# Patient Record
Sex: Female | Born: 2005 | Race: Black or African American | Hispanic: No | Marital: Single | Smoking: Never smoker
Health system: Southern US, Community
[De-identification: ages and names within clinical notes are randomized; demographics above are authoritative.]

## PROBLEM LIST (undated history)

## (undated) DIAGNOSIS — T783XXA Angioneurotic edema, initial encounter: Secondary | ICD-10-CM

## (undated) DIAGNOSIS — J302 Other seasonal allergic rhinitis: Secondary | ICD-10-CM

## (undated) DIAGNOSIS — L509 Urticaria, unspecified: Secondary | ICD-10-CM

## (undated) DIAGNOSIS — L309 Dermatitis, unspecified: Secondary | ICD-10-CM

## (undated) HISTORY — DX: Urticaria, unspecified: L50.9

## (undated) HISTORY — DX: Other seasonal allergic rhinitis: J30.2

## (undated) HISTORY — DX: Angioneurotic edema, initial encounter: T78.3XXA

## (undated) HISTORY — DX: Dermatitis, unspecified: L30.9

## (undated) HISTORY — PX: UMBILICAL HERNIA REPAIR: SHX2598

---
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
## 2023-01-01 ENCOUNTER — Encounter: Payer: Self-pay | Admitting: Family Medicine

## 2023-01-01 ENCOUNTER — Telehealth: Payer: Medicaid Other | Admitting: Family Medicine

## 2023-01-01 DIAGNOSIS — L2089 Other atopic dermatitis: Secondary | ICD-10-CM | POA: Diagnosis not present

## 2023-01-01 MED ORDER — PREDNISONE 10 MG (21) PO TBPK: ORAL_TABLET | ORAL | 0 refills | Status: AC

## 2023-01-01 NOTE — Progress Notes (Signed)
Virtual Visit Consent   Julie Wood, you are scheduled for a virtual visit with a [facility] provider today. Just as with appointments in the office, your consent must be obtained to participate. Your consent will be active for this visit and any virtual visit you may have with one of our providers in the next 365 days. If you have a MyChart account, a copy of this consent can be sent to you electronically.  As this is a virtual visit, video technology does not allow for your provider to perform a traditional examination. This may limit your provider's ability to fully assess your condition. If your provider identifies any concerns that need to be evaluated in person or the need to arrange testing (such as labs, EKG, etc.), we will make arrangements to do so. Although advances in technology are sophisticated, we cannot ensure that it will always work on either your end or our end. If the connection with a video visit is poor, the visit may have to be switched to a telephone visit. With either a video or telephone visit, we are not always able to ensure that we have a secure connection.  By engaging in this virtual visit, you consent to the provision of healthcare and authorize for your insurance to be billed (if applicable) for the services provided during this visit. Depending on your insurance coverage, you may receive a charge related to this service.  I need to obtain your verbal consent now. Are you willing to proceed with your visit today? Julie Wood has provided verbal consent on 01/01/2023 for a virtual visit (video or telephone). Freddy Finner, NP  Date: 01/01/2023 9:05 AM  Virtual Visit via Video Note   I, Freddy Finner, connected with  Julie Wood  ([id number], 05/08/2005) on 01/01/23 at  9:00 AM EST by a video-enabled telemedicine application and verified that I am speaking with the correct person using two identifiers.  Location: Patient: Virtual Visit Location Patient:  Home Provider: Virtual Visit Location Provider: Home Office   I discussed the limitations of evaluation and management by telemedicine and the availability of in person appointments. The patient expressed understanding and agreed to proceed.    History of Present Illness: Julie Wood is a 17 y.o. 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   Oral allergy syndrome, subsequent encounter 03/25/2018   Seasonal and perennial allergic rhinoconjunctivitis 03/25/2018   Allergic rhinitis 05/15/2013   Other atopic dermatitis 05/15/2013    Allergies:  Allergies  Allergen Reactions   Peanut-Containing Drug Products    Citrus    Other     Tree nuts Raw potato -hives with contact exposure Melons   Shellfish Allergy    Medications:  Current Outpatient Medications:    busPIRone (BUSPAR) 7.5 MG tablet, Take 7.5 mg by mouth 2 (two) times daily., Disp: , Rfl:    cetirizine (ZYRTEC) 10 MG tablet, Take 1 tablet daily as needed for runny nose or itching, Disp: 30 tablet, Rfl: 5   CONCERTA 27 MG  CR tablet, , Disp: , Rfl:    Crisaborole (EUCRISA) 2 % OINT, Apply 1 application  topically 2 (two) times daily as needed (eczema flare)., Disp: 60 g, Rfl: 5   cromolyn (OPTICROM) 4 % ophthalmic solution, Place 1 drop into both eyes 4 (four) times daily as needed (watery itchy eyes)., Disp: 10 mL, Rfl: 5   desonide (DESOWEN) 0.05 % cream, Use 1 application sparingly twice a day as needed to red itchy areas.  This is safe to use on face and neck, Disp:  30 g, Rfl: 5   DUPIXENT 300 MG/2ML prefilled syringe, INJECT 300MG  SUBCUTANEOUSLY  EVERY OTHER WEEK, Disp: 4 mL, Rfl: 11   EPINEPHrine (AUVI-Q) 0.3 mg/0.3 mL IJ SOAJ injection, Inject 0.3 mg into the muscle once as needed for up to 1 dose. (Patient not taking: Reported on 02/20/2022), Disp: 2 each, Rfl: 1   EPINEPHrine 0.3 mg/0.3 mL IJ SOAJ injection, Inject 0.3 mg into the muscle as needed for anaphylaxis., Disp: 2 each, Rfl: 1   fluticasone (FLONASE) 50 MCG/ACT nasal spray, Place 1 spray into both nostrils 2 (two) times daily., Disp: , Rfl:    hydrocortisone 2.5 % cream, Apply topically 2 (two) times daily as needed (eczema). Okay to use on the face sparingly - 1 week at a time max., Disp: 30 g, Rfl: 2   montelukast (SINGULAIR) 10 MG tablet, Take 1 tablet (10 mg total) by mouth at bedtime., Disp: 30 tablet, Rfl: 5   Olopatadine HCl (PATADAY) 0.2 % SOLN, Place 1 drop into both eyes daily. Use as needed for itchy water eyes, Disp: 2.5 mL, Rfl: 5   triamcinolone (NASACORT) 55 MCG/ACT AERO nasal inhaler, Place 2 sprays into the nose daily as needed (nasal congestion)., Disp: 1 each, Rfl: 5   triamcinolone cream (KENALOG) 0.1 %, Apply 1 Application topically 2 (two) times daily as needed (eczema flare). Do not use on the face, neck, armpits or groin area. Do not use more than 3 weeks in a row., Disp: 80 g, Rfl: 3  Observations/Objective: Patient is well-developed, well-nourished in no acute distress.  Resting comfortably  at home.  Head is normocephalic, atraumatic.  No labored breathing.  Speech is clear and coherent with logical content.  Patient is alert and oriented at baseline.    Assessment and Plan:  1. Other atopic dermatitis  - predniSONE (STERAPRED UNI-PAK 21 TAB) 10 MG (21) TBPK tablet; Take as directed  Dispense: 21 tablet; Refill: 0  -follow up with allergist  -continue creams and as ordered ointments -take pred and if not better be seen in person -avoid shellfish    Reviewed  side effects, risks and benefits of medication.    Patient acknowledged agreement and understanding of the plan.   Past Medical, Surgical, Social History, Allergies, and Medications have been Reviewed.   Follow Up Instructions: I discussed the assessment and treatment plan with the patient. The patient was provided an opportunity to ask questions and all were answered. The patient agreed with the plan and demonstrated an understanding of the instructions.  A copy of instructions were sent to the patient via MyChart unless otherwise noted below.    The patient was advised to call back or seek an in-person evaluation if the symptoms worsen or if the condition fails to improve as anticipated.    Freddy Finner, NP

## 2023-01-01 NOTE — Patient Instructions (Addendum)
Julie Wood, thank you for joining Julie Finner, NP for today's virtual visit.  While this provider is not your primary care provider (PCP), if your PCP is located in our provider database this encounter information will be shared with them immediately following your visit.   A [facility] MyChart account gives you access to today's visit and all your visits, tests, and labs performed at [facility] " click here if you don't have a [facility] MyChart account or go to [email or web address]  Consent: (Patient) Julie Wood provided verbal consent for this virtual visit at the beginning of the encounter.  Current Medications:  Current Outpatient Medications:    predniSONE (STERAPRED UNI-PAK 21 TAB) 10 MG (21) TBPK tablet, Take as directed, Disp: 21 tablet, Rfl: 0   busPIRone (BUSPAR) 7.5 MG tablet, Take 7.5 mg by mouth 2 (two) times daily., Disp: , Rfl:    cetirizine (ZYRTEC) 10 MG tablet, Take 1 tablet daily as needed for runny nose or itching, Disp: 30 tablet, Rfl: 5   CONCERTA 27 MG CR tablet, , Disp: , Rfl:    Crisaborole (EUCRISA) 2 % OINT, Apply 1 application  topically 2 (two) times daily as needed (eczema flare)., Disp: 60 g, Rfl: 5   cromolyn (OPTICROM) 4 % ophthalmic solution, Place 1 drop into both eyes 4 (four) times daily as needed (watery itchy eyes)., Disp: 10 mL, Rfl: 5   desonide (DESOWEN) 0.05 % cream, Use 1 application sparingly twice a day as needed to red itchy areas.  This is safe to use on face and neck, Disp: 30 g, Rfl: 5   DUPIXENT 300 MG/2ML prefilled syringe, INJECT 300MG  SUBCUTANEOUSLY  EVERY OTHER WEEK, Disp: 4 mL, Rfl: 11   EPINEPHrine (AUVI-Q) 0.3 mg/0.3 mL IJ SOAJ injection, Inject 0.3 mg into the muscle once as needed for up to 1 dose. (Patient not taking: Reported on 02/20/2022), Disp: 2 each, Rfl: 1   EPINEPHrine 0.3 mg/0.3 mL IJ SOAJ injection, Inject 0.3 mg into the muscle as needed for anaphylaxis., Disp: 2 each, Rfl: 1   fluticasone  (FLONASE) 50 MCG/ACT nasal spray, Place 1 spray into both nostrils 2 (two) times daily., Disp: , Rfl:    hydrocortisone 2.5 % cream, Apply topically 2 (two) times daily as needed (eczema). Okay to use on the face sparingly - 1 week at a time max., Disp: 30 g, Rfl: 2   montelukast (SINGULAIR) 10 MG tablet, Take 1 tablet (10 mg total) by mouth at bedtime., Disp: 30 tablet, Rfl: 5   Olopatadine HCl (PATADAY) 0.2 % SOLN, Place 1 drop into both eyes daily. Use as needed for itchy water eyes, Disp: 2.5 mL, Rfl: 5   triamcinolone (NASACORT) 55 MCG/ACT AERO nasal inhaler, Place 2 sprays into the nose daily as needed (nasal congestion)., Disp: 1 each, Rfl: 5   triamcinolone cream (KENALOG) 0.1 %, Apply 1 Application topically 2 (two) times daily as needed (eczema flare). Do not use on the face, neck, armpits or groin area. Do not use more than 3 weeks in a row., Disp: 80 g, Rfl: 3   Medications ordered in this encounter:  Meds ordered this encounter  Medications   predniSONE (STERAPRED UNI-PAK 21 TAB) 10 MG (21) TBPK tablet    Sig: Take as directed    Dispense:  21 tablet    Refill:  0    Order Specific Question:   Supervising Provider    AnswerMerrilee Jansky [id number]     *If  you need refills on other medications prior to your next appointment, please contact your pharmacy*  Follow-Up: Call back or seek an in-person evaluation if the symptoms worsen or if the condition fails to improve as anticipated.  [facility] [phone]  Other Instructions  -follow up with allergist  -continue creams and as ordered ointments -take pred and if not better be seen in person -avoid shellfish   If you have been instructed to have an in-person evaluation today at a local Urgent Care facility, please use the link below. It will take you to a list of all of our available [facility], including address, phone number and hours of operation. Please do not delay care.  [facility]  If you or a family member do not have a primary care provider, use the link below to schedule a visit and establish care. When you choose a [facility] primary care physician or advanced practice provider, you gain a long-term partner in health. Find a Primary Care Provider  Learn more about [facility]'s in-office and virtual care options: [facility] - Get Care Now

## 2023-03-03 ENCOUNTER — Other Ambulatory Visit: Payer: Self-pay

## 2023-03-03 ENCOUNTER — Ambulatory Visit (INDEPENDENT_AMBULATORY_CARE_PROVIDER_SITE_OTHER): Payer: Medicaid Other | Admitting: Family Medicine

## 2023-03-03 ENCOUNTER — Encounter: Payer: Self-pay | Admitting: Family Medicine

## 2023-03-03 VITALS — BP 102/60 | HR 88 | Temp 98.0°F | Resp 16 | Ht 66.54 in | Wt 199.0 lb

## 2023-03-03 DIAGNOSIS — J302 Other seasonal allergic rhinitis: Secondary | ICD-10-CM

## 2023-03-03 DIAGNOSIS — H1013 Acute atopic conjunctivitis, bilateral: Secondary | ICD-10-CM | POA: Diagnosis not present

## 2023-03-03 DIAGNOSIS — J3089 Other allergic rhinitis: Secondary | ICD-10-CM | POA: Diagnosis not present

## 2023-03-03 DIAGNOSIS — L2089 Other atopic dermatitis: Secondary | ICD-10-CM

## 2023-03-03 DIAGNOSIS — T7800XD Anaphylactic reaction due to unspecified food, subsequent encounter: Secondary | ICD-10-CM

## 2023-03-03 DIAGNOSIS — H1010 Acute atopic conjunctivitis, unspecified eye: Secondary | ICD-10-CM

## 2023-03-03 MED ORDER — EPINEPHRINE 0.3 MG/0.3ML IJ SOAJ: 0.3000 mg | INTRAMUSCULAR | 1 refills | Status: AC | PRN

## 2023-03-03 MED ORDER — MONTELUKAST SODIUM 10 MG PO TABS: 10.0000 mg | ORAL_TABLET | Freq: Every day | ORAL | 5 refills | Status: DC

## 2023-03-03 MED ORDER — TRIAMCINOLONE ACETONIDE 0.1 % EX CREA: 1.0000 | TOPICAL_CREAM | Freq: Two times a day (BID) | CUTANEOUS | 5 refills | Status: DC | PRN

## 2023-03-03 MED ORDER — MOMETASONE FUROATE 0.1 % EX OINT: TOPICAL_OINTMENT | Freq: Every day | CUTANEOUS | 0 refills | Status: DC

## 2023-03-03 NOTE — Progress Notes (Addendum)
 [address] [place] [address] Dept: [phone]  FOLLOW UP NOTE  Patient ID: Julie Wood, female    DOB: [birth year]  Age: 18 y.o. MRN: [id number] Date of Office Visit: 03/03/2023  Assessment  Chief Complaint: Follow-up and Medication Refill  HPI Julie Wood is a 18 year old female who presents to the clinic for follow-up visit.  She was last seen in this clinic on 07/08/2022 by Wanda Craze, FNP, for evaluation of allergic rhinitis on allergen immunotherapy, allergic conjunctivitis, atopic dermatitis, and food allergy  to peanut, tree nut, citrus, and shellfish.   At today's visit, she reports her allergic rhinitis has been moderately well-controlled with occasional sneezing as the main symptom.  She does report some symptoms occurring mainly in the spring.  She continues montelukast  10 mg once a day, an antihistamine as needed, and Nasacort  as needed.  She originally began allergen immunotherapy on 07/29/2017, however, she has been very inconsistent with receiving allergen immunotherapy.  Chart review indicates allergen immunotherapy has been restarted 6 times.  We discussed updating her allergy  testing possibly initiating oral immunotherapy directed toward Timothy grass, short ragweed, or dust mite.  Allergic conjunctivitis is reported as moderately well-controlled with occasional red and itchy eyes for which he uses cromolyn  eyedrops with relief of symptoms.  Atopic dermatitis is reported as moderately well-controlled with occasional red and itchy areas occurring in a flare of remission pattern occurring mainly in the antecubital fossa, back of her neck, back of her ears, on the top of her eyes, and the sides of her nose.  She continues a twice a day moisturizing routine and has been using triamcinolone  on her face.  She continues Dupixent  injections 300 mg once every 14 days with no large or local reactions.  She reports a significant decrease in her symptoms of atopic  dermatitis while continuing on Dupixent  injections.  She reports that she tried her friend's medication with relief of symptoms.  She reported this medication was mometasone .  We discussed the use of triamcinolone  or mometasone  only below her face and discussed using a nonsteroid medication on red and itchy areas occurring on her face.  She continues to avoid peanuts, tree nuts, shellfish, and citrus fruits with no accidental ingestion or EpiPen  use since her last visit to this clinic.  Her last food allergy  skin testing was on 07/09/2022 and was negative to selected foods with adequate controls.  EpiPen 's at this time will be reordered at today's visit.  Her current medications are listed in the chart.  Drug Allergies:  Allergies  Allergen Reactions   Peanut-Containing Drug Products    Citrus    Other     Tree nuts Raw potato -hives with contact exposure Melons   Shellfish Allergy      Physical Exam: BP 102/60   Pulse 88   Temp 98 F (36.7 C) (Temporal)   Resp 16   Ht 5' 6.54 (1.69 m)   Wt 199 lb (90.3 kg)   SpO2 98%   BMI 31.60 kg/m    Physical Exam Vitals reviewed.  Constitutional:      Appearance: Normal appearance.  HENT:     Head: Normocephalic and atraumatic.     Right Ear: Tympanic membrane normal.     Left Ear: Tympanic membrane normal.     Nose:     Comments: Bilateral naris normal.  Pharynx normal.  Ears normal.  Eyes normal.    Mouth/Throat:     Pharynx: Oropharynx is clear.  Eyes:  Conjunctiva/sclera: Conjunctivae normal.  Cardiovascular:     Rate and Rhythm: Normal rate and regular rhythm.     Heart sounds: Normal heart sounds. No murmur heard. Pulmonary:     Effort: Pulmonary effort is normal.     Breath sounds: Normal breath sounds.     Comments: Lungs clear to auscultation Musculoskeletal:        General: Normal range of motion.     Cervical back: Normal range of motion and neck supple.  Skin:    General: Skin is warm and dry.     Comments:  Some dry and reddened areas noted on both sides of her nose.  No open areas or drainage noted.  Neurological:     Mental Status: She is alert and oriented to person, place, and time.  Psychiatric:        Mood and Affect: Mood normal.        Behavior: Behavior normal.        Thought Content: Thought content normal.        Judgment: Judgment normal.     Assessment and Plan: 1. Seasonal and perennial allergic rhinitis   2. Seasonal allergic conjunctivitis   3. Other atopic dermatitis   4. Anaphylactic reaction due to food, subsequent encounter     Meds ordered this encounter  Medications   EPINEPHrine  0.3 mg/0.3 mL IJ SOAJ injection    Sig: Inject 0.3 mg into the muscle as needed for anaphylaxis.    Dispense:  2 each    Refill:  1    May dispense generic/Mylan/Teva brand.   DISCONTD: triamcinolone  cream (KENALOG ) 0.1 %    Sig: Apply 1 Application topically 2 (two) times daily as needed (eczema flare). Do not use on the face, neck, armpits or groin area. Do not use more than 3 weeks in a row.    Dispense:  80 g    Refill:  5   montelukast  (SINGULAIR ) 10 MG tablet    Sig: Take 1 tablet (10 mg total) by mouth at bedtime.    Dispense:  30 tablet    Refill:  5   mometasone  (ELOCON ) 0.1 % ointment    Sig: Apply topically daily.    Dispense:  45 g    Refill:  0    Patient Instructions  Allergic rhinitis Continue allergen avoidance measures directed toward grass pollen, weed pollen, ragweed pollen, tree pollen, mold, dust mite, cat, and dog as listed below Continue an antihistamine once a day as needed for runny nose or itch. Remember to rotate to a different antihistamine about every 3 months. Some examples of over the counter antihistamines include Zyrtec  (cetirizine ), Xyzal  (levocetirizine), Allegra  (fexofenadine ), and Claritin (loratidine).  Continue montelukast  10 mg once a day for allergy  symptom control Continue Nasacort  1 to 2 sprays in each nostril once a day as needed for a  stuffy nose Consider saline nasal rinses as needed for nasal symptoms. Use this before any medicated nasal sprays for best result Return to the clinic or any [facility] to get the allergy  testing. We will call you when the results become available. We will call you when the results become available If positive to Timothy grass, short ragweed, or dust mites, consider oral immunotherapy.  Allergic conjunctivitis Continue cromolyn  1 to 2 drops in each eye up to 4 times a day if needed for breakthrough hives  Atopic dermatitis Continue a twice a day moisturizing routine Begin Protopic  to red and itchy areas up to twice a day  for red or itchy areas For stubborn red itchy areas below your face, begin mometasome once a day as needed.  Do not use this medication for longer than 2 weeks in a row. This will replace triamcinolone   Food allergy   Continue to avoid peanuts, tree nuts, citrus, and shellfish. In case of an allergic reaction, give Benadryl  50 mg every 4 hours, and if life-threatening symptoms occur, inject with EpiPen  0.3 mg. A lab has been ordered to help us  evaluate your food allergies.  We will call you when the labs become available.   Call the clinic if this treatment plan is not working well for you.  Follow up in 3 months or sooner if needed.   Return in about 3 months (around 06/01/2023), or if symptoms worsen or fail to improve.    Thank you for the opportunity to care for this patient.  Please do not hesitate to contact me with questions.  Arlean Mutter, FNP [facility] 

## 2023-03-03 NOTE — Patient Instructions (Addendum)
 Allergic rhinitis Continue allergen avoidance measures directed toward grass pollen, weed pollen, ragweed pollen, tree pollen, mold, dust mite, cat, and dog as listed below Continue an antihistamine once a day as needed for runny nose or itch. Remember to rotate to a different antihistamine about every 3 months. Some examples of over the counter antihistamines include Zyrtec  (cetirizine ), Xyzal  (levocetirizine), Allegra  (fexofenadine ), and Claritin (loratidine).  Continue montelukast  10 mg once a day for allergy  symptom control Continue Nasacort  1 to 2 sprays in each nostril once a day as needed for a stuffy nose Consider saline nasal rinses as needed for nasal symptoms. Use this before any medicated nasal sprays for best result Return to the clinic or any [facility] to get the allergy  testing. We will call you when the results become available. We will call you when the results become available If positive to Timothy grass, short ragweed, or dust mites, consider oral immunotherapy.  Allergic conjunctivitis Continue cromolyn  1 to 2 drops in each eye up to 4 times a day if needed for breakthrough hives  Atopic dermatitis Continue a twice a day moisturizing routine Begin Protopic  to red and itchy areas up to twice a day for red or itchy areas For stubborn red itchy areas below your face, begin mometasone  once a day as needed.  Do not use this medication for longer than 2 weeks in a row. This will replace triamcinolone   Food allergy   Continue to avoid peanuts, tree nuts, citrus, and shellfish. In case of an allergic reaction, give Benadryl  50 mg every 4 hours, and if life-threatening symptoms occur, inject with EpiPen  0.3 mg. A lab has been ordered to help us  evaluate your food allergies.  We will call you when the labs become available.  Call the clinic if this treatment plan is not working well for you.  Follow up in 3 months or sooner if needed.  Reducing Pollen Exposure The American Academy  of Allergy , Asthma and Immunology suggests the following steps to reduce your exposure to pollen during allergy  seasons. Do not hang sheets or clothing out to dry; pollen may collect on these items. Do not mow lawns or spend time around freshly cut grass; mowing stirs up pollen. Keep windows closed at night.  Keep car windows closed while driving. Minimize morning activities outdoors, a time when pollen counts are usually at their highest. Stay indoors as much as possible when pollen counts or humidity is high and on windy days when pollen tends to remain in the air longer. Use air conditioning when possible.  Many air conditioners have filters that trap the pollen spores. Use a HEPA room air filter to remove pollen form the indoor air you breathe.  Control of Mold Allergen Mold and fungi can grow on a variety of surfaces provided certain temperature and moisture conditions exist.  Outdoor molds grow on plants, decaying vegetation and soil.  The major outdoor mold, Alternaria and Cladosporium, are found in very high numbers during hot and dry conditions.  Generally, a late Summer - Fall peak is seen for common outdoor fungal spores.  Rain will temporarily lower outdoor mold spore count, but counts rise rapidly when the rainy period ends.  The most important indoor molds are Aspergillus and Penicillium.  Dark, humid and poorly ventilated basements are ideal sites for mold growth.  The next most common sites of mold growth are the bathroom and the kitchen.  Outdoor [employer] Use air conditioning and keep windows closed Avoid exposure to decaying vegetation.  Avoid leaf raking. Avoid grain handling. Consider wearing a face mask if working in moldy areas.  Indoor Mold Control Maintain humidity below 50%. Clean washable surfaces with 5% bleach solution. Remove sources e.g. Contaminated carpets.   Control of Dust Mite Allergen Dust mites play a major role in allergic asthma and rhinitis. They  occur in environments with high humidity wherever human skin is found. Dust mites absorb humidity from the atmosphere (ie, they do not drink) and feed on organic matter (including shed human and animal skin). Dust mites are a microscopic type of insect that you cannot see with the naked eye. High levels of dust mites have been detected from mattresses, pillows, carpets, upholstered furniture, bed covers, clothes, soft toys and any woven material. The principal allergen of the dust mite is found in its feces. A gram of dust may contain 1,000 mites and 250,000 fecal particles. Mite antigen is easily measured in the air during house cleaning activities. Dust mites do not bite and do not cause harm to humans, other than by triggering allergies/asthma.  Ways to decrease your exposure to dust mites in your home:  1. Encase mattresses, box springs and pillows with a mite-impermeable barrier or cover  2. Wash sheets, blankets and drapes weekly in hot water (130 F) with detergent and dry them in a dryer on the hot setting.  3. Have the room cleaned frequently with a vacuum cleaner and a damp dust-mop. For carpeting or rugs, vacuuming with a vacuum cleaner equipped with a high-efficiency particulate air (HEPA) filter. The dust mite allergic individual should not be in a room which is being cleaned and should wait 1 hour after cleaning before going into the room.  4. Do not sleep on upholstered furniture (eg, couches).  5. If possible removing carpeting, upholstered furniture and drapery from the home is ideal. Horizontal blinds should be eliminated in the rooms where the person spends the most time (bedroom, study, television room). Washable vinyl, roller-type shades are optimal.  6. Remove all non-washable stuffed toys from the bedroom. Wash stuffed toys weekly like sheets and blankets above.  7. Reduce indoor humidity to less than 50%. Inexpensive humidity monitors can be purchased at most hardware stores.  Do not use a humidifier as can make the problem worse and are not recommended.  Control of Dog or Cat Allergen Avoidance is the best way to manage a dog or cat allergy . If you have a dog or cat and are allergic to dog or cats, consider removing the dog or cat from the home. If you have a dog or cat but don't want to find it a new home, or if your family wants a pet even though someone in the household is allergic, here are some strategies that may help keep symptoms at bay:  Keep the pet out of your bedroom and restrict it to only a few rooms. Be advised that keeping the dog or cat in only one room will not limit the allergens to that room. Don't pet, hug or kiss the dog or cat; if you do, wash your hands with soap and water. High-efficiency particulate air (HEPA) cleaners run continuously in a bedroom or living room can reduce allergen levels over time. Regular use of a high-efficiency vacuum cleaner or a central vacuum can reduce allergen levels. Giving your dog or cat a bath at least once a week can reduce airborne allergen.

## 2023-03-31 ENCOUNTER — Other Ambulatory Visit: Payer: Self-pay | Admitting: Internal Medicine

## 2023-03-31 ENCOUNTER — Telehealth: Payer: Self-pay | Admitting: Allergy

## 2023-03-31 MED ORDER — MOMETASONE FUROATE 0.1 % EX OINT: TOPICAL_OINTMENT | CUTANEOUS | 0 refills | Status: DC

## 2023-03-31 MED ORDER — TACROLIMUS 0.1 % EX OINT: TOPICAL_OINTMENT | CUTANEOUS | 0 refills | Status: DC

## 2023-03-31 MED ORDER — TRIAMCINOLONE ACETONIDE 0.1 % EX OINT: TOPICAL_OINTMENT | CUTANEOUS | 5 refills | Status: DC

## 2023-03-31 NOTE — Telephone Encounter (Signed)
 I called patient and left a message to call the office back. Patient called the office back as I was documenting. Patient is currently having a flare up despite being on dupixent . I Informed patient Protopic  and Mometasone  have been sent into [facility] [address] in [place]. Patient requests the triamcinolone  sent in as that helps her. I informed per last [facility] Mometasone  replaces the triamcinolone . I informed I would send a message to on call provider as Rexene Catching is out of the office. Please advise if okay to send in or if she needs try Protopic  and Mometasone .

## 2023-03-31 NOTE — Addendum Note (Signed)
 Addended by: Corinda Dibbles on: 03/31/2023 04:02 PM   Modules accepted: Orders

## 2023-03-31 NOTE — Progress Notes (Signed)
 Prefers triamcinolone  topica over mometasone , will send in refills.

## 2023-03-31 NOTE — Telephone Encounter (Signed)
 Julie Wood called back and states she doesn't mean to be difficult but she really needs ELOCON  and KENALOG  called in to [facility] [address] in [place].  She is having a flare up.

## 2023-03-31 NOTE — Telephone Encounter (Signed)
 I called patient and informed. She will try the protopic  and mometasone  and go back to triamcinolone  if needed. Patient prefers [facility]. It has been sent in.

## 2023-03-31 NOTE — Telephone Encounter (Signed)
 Patient called requesting to have all of her medications to be sent to [facility] [address] in [place] since she goes to school there.

## 2023-04-27 ENCOUNTER — Other Ambulatory Visit: Payer: Self-pay | Admitting: Family Medicine

## 2023-05-08 ENCOUNTER — Ambulatory Visit: Payer: Medicaid Other | Admitting: Allergy

## 2023-05-11 ENCOUNTER — Telehealth: Admitting: Family

## 2023-05-11 ENCOUNTER — Telehealth: Admitting: Physician Assistant

## 2023-05-11 DIAGNOSIS — B3731 Acute candidiasis of vulva and vagina: Secondary | ICD-10-CM | POA: Diagnosis not present

## 2023-05-11 MED ORDER — FLUCONAZOLE 150 MG PO TABS: 150.0000 mg | ORAL_TABLET | Freq: Every day | ORAL | 0 refills | Status: DC

## 2023-05-11 MED ORDER — FLUCONAZOLE 150 MG PO TABS: 150.0000 mg | ORAL_TABLET | ORAL | 0 refills | Status: DC | PRN

## 2023-05-11 NOTE — Progress Notes (Signed)

## 2023-05-11 NOTE — Progress Notes (Signed)
 Virtual Visit Consent   Julie Wood, you are scheduled for a virtual visit with a [facility] provider today. Just as with appointments in the office, your consent must be obtained to participate. Your consent will be active for this visit and any virtual visit you may have with one of our providers in the next 365 days. If you have a MyChart account, a copy of this consent can be sent to you electronically.  As this is a virtual visit, video technology does not allow for your provider to perform a traditional examination. This may limit your provider's ability to fully assess your condition. If your provider identifies any concerns that need to be evaluated in person or the need to arrange testing (such as labs, EKG, etc.), we will make arrangements to do so. Although advances in technology are sophisticated, we cannot ensure that it will always work on either your end or our end. If the connection with a video visit is poor, the visit may have to be switched to a telephone visit. With either a video or telephone visit, we are not always able to ensure that we have a secure connection.  By engaging in this virtual visit, you consent to the provision of healthcare and authorize for your insurance to be billed (if applicable) for the services provided during this visit. Depending on your insurance coverage, you may receive a charge related to this service.  I need to obtain your verbal consent now. Are you willing to proceed with your visit today? Julie Wood has provided verbal consent on 05/11/2023 for a virtual visit (video or telephone). Jannifer Rodney, FNP  Date: 05/11/2023 4:00 PM   Virtual Visit via Video Note   I, Jannifer Rodney, connected with  Julie Wood  ([id number], Aug 29, 2005) on 05/11/23 at  4:00 PM EDT by a video-enabled telemedicine application and verified that I am speaking with the correct person using two identifiers.  Location: Patient: Virtual Visit Location Patient:  Home Provider: Virtual Visit Location Provider: Home Office   I discussed the limitations of evaluation and management by telemedicine and the availability of in person appointments. The patient expressed understanding and agreed to proceed.    History of Present Illness: Julie Wood is a 18 y.o. who identifies as a female who was assigned female at birth, and is being seen today for vaginal itching after taking amoxicillin.  HPI: Vaginal Itching The patient's primary symptoms include genital itching and vaginal discharge. The patient's pertinent negatives include no genital odor or vaginal bleeding. This is a new problem. The current episode started in the past 7 days. The patient is experiencing no pain. The vaginal discharge was white and clear.    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   Oral allergy syndrome, subsequent encounter 03/25/2018   Seasonal allergic conjunctivitis 03/25/2018   Allergic rhinitis 05/15/2013   Other atopic dermatitis 05/15/2013    Allergies:  Allergies  Allergen Reactions   Peanut-Containing Drug Products    Citrus    Other     Tree nuts Raw potato -hives with contact exposure Melons   Shellfish Allergy    Medications:  Current Outpatient Medications:    fluconazole (DIFLUCAN) 150 MG tablet, Take 1 tablet (150 mg total) by mouth every three (3) days as needed., Disp: 2 tablet, Rfl: 0   busPIRone (BUSPAR) 7.5 MG tablet, Take 7.5 mg by mouth 2 (two) times daily., Disp: ,  Rfl:    cetirizine (ZYRTEC) 10 MG tablet, Take 1 tablet daily as needed for runny nose or itching, Disp: 30 tablet, Rfl: 5   CONCERTA 27 MG CR tablet, , Disp: , Rfl:    Crisaborole (EUCRISA) 2 % OINT, Apply 1 application  topically 2 (two) times daily as needed (eczema flare)., Disp: 60 g, Rfl: 5   cromolyn (OPTICROM) 4 % ophthalmic solution, Place 1 drop into both eyes 4 (four) times daily  as needed (watery itchy eyes)., Disp: 10 mL, Rfl: 5   DUPIXENT 300 MG/2ML prefilled syringe, INJECT 300MG  SUBCUTANEOUSLY  EVERY OTHER WEEK, Disp: 4 mL, Rfl: 11   EPINEPHrine 0.3 mg/0.3 mL IJ SOAJ injection, Inject 0.3 mg into the muscle as needed for anaphylaxis., Disp: 2 each, Rfl: 1   fluticasone (FLONASE) 50 MCG/ACT nasal spray, Place 1 spray into both nostrils 2 (two) times daily., Disp: , Rfl:    mometasone (ELOCON) 0.1 % ointment, For stubborn red itchy areas below your face, begin mometasome once a day as needed.  Do not use this medication for longer than 2 weeks in a row., Disp: 45 g, Rfl: 0   montelukast (SINGULAIR) 10 MG tablet, Take 1 tablet (10 mg total) by mouth at bedtime., Disp: 30 tablet, Rfl: 5   Olopatadine HCl (PATADAY) 0.2 % SOLN, Place 1 drop into both eyes daily. Use as needed for itchy water eyes, Disp: 2.5 mL, Rfl: 5   predniSONE (STERAPRED UNI-PAK 21 TAB) 10 MG (21) TBPK tablet, Take as directed (Patient not taking: Reported on 03/03/2023), Disp: 21 tablet, Rfl: 0   tacrolimus (PROTOPIC) 0.1 % ointment, APPLY TOPICALLY TO RED AND ITCHY AREAS UP TO TWICE A DAY, Disp: 100 g, Rfl: 0   triamcinolone (NASACORT) 55 MCG/ACT AERO nasal inhaler, Place 2 sprays into the nose daily as needed (nasal congestion)., Disp: 1 each, Rfl: 5   triamcinolone ointment (KENALOG) 0.1 %, Apply twice daily for flare ups below neck, maximum 10 days., Disp: 80 g, Rfl: 5  Observations/Objective: Patient is well-developed, well-nourished in no acute distress.  Resting comfortably  at home.  Head is normocephalic, atraumatic.  No labored breathing.  Speech is clear and coherent with logical content.  Patient is alert and oriented at baseline.    Assessment and Plan: 1. Vagina, candidiasis (Primary) - fluconazole (DIFLUCAN) 150 MG tablet; Take 1 tablet (150 mg total) by mouth every three (3) days as needed.  Dispense: 2 tablet; Refill: 0  Keep clean and dry Avoid scratching  Follow up if symptoms  worsen or do not improve   Follow Up Instructions: I discussed the assessment and treatment plan with the patient. The patient was provided an opportunity to ask questions and all were answered. The patient agreed with the plan and demonstrated an understanding of the instructions.  A copy of instructions were sent to the patient via MyChart unless otherwise noted below.     The patient was advised to call back or seek an in-person evaluation if the symptoms worsen or if the condition fails to improve as anticipated.    Jannifer Rodney, FNP

## 2023-06-18 ENCOUNTER — Telehealth: Payer: Self-pay | Admitting: Allergy

## 2023-06-18 NOTE — Telephone Encounter (Signed)
 Mom came in to the office and states she has been calling for several days and isn't receiving any phone calls back.  I do not see any messages in the chart.  Mom states Rhyan is going to be staying in [place]  and will need her vials transferred to:  [facility]  FAX: [phone]  They are requesting a referral and medical records which I am taking car of now.

## 2023-06-20 NOTE — Telephone Encounter (Signed)
 At her last appointment, on 03/03/2023, I ordered environmental allergy  testing which she has not gotten yet. In the after visit summary I wrote if the testing was positive to Timothy grass, ragweed or dust mite that she could consider oral immunotherapy.  From my note- Return to the clinic or any [facility] to get the allergy  testing. We will call you when the results become available. We will call you when the results become available If positive to Timothy grass, short ragweed, or dust mites, consider oral immunotherapy. Thank you for your help.

## 2023-06-20 NOTE — Telephone Encounter (Signed)
 Spoke with patient, informed her that her allergy  injection vials expired on 08/31/2022. I informed her that it would be best to go to the [facility]  to establish care and have them make her vials. Patient stated she was informed by Rexene Catching to start the pill form of immunotherapy. Patient stated that she had an appointment to start but had to canceled due to being in exams. I didn't see where the medication was sent in for her to start. Anne please advise. Patient would also like Dr. Tempie Fee to call her or her mother in regards to this matter.

## 2023-06-24 NOTE — Telephone Encounter (Signed)
Left a message for patient to call the office in regards to Dr. Randell Patient note.

## 2023-07-08 NOTE — Telephone Encounter (Signed)
 Called patient - DOB/DPR verified - LMOVM advising:  Per Dr. Tempie Fee  Thanks everyone.    Is she moving there permanently or temporary?  If more of a permanent move would agree with establishing care locally and having them take over her allergy  management.  If more of a temporary move then would advise as Rexene Catching has noted with updating allergy  testing to see if oral immunotherapy would even be a good option.    I will also send a myChart message with both provider recommendation.  If/When patient call back - please advise of both providers recommendations.

## 2023-07-23 ENCOUNTER — Encounter: Payer: Self-pay | Admitting: Physician Assistant

## 2023-07-23 ENCOUNTER — Telehealth: Admitting: Physician Assistant

## 2023-07-23 DIAGNOSIS — J029 Acute pharyngitis, unspecified: Secondary | ICD-10-CM | POA: Diagnosis not present

## 2023-07-23 DIAGNOSIS — J351 Hypertrophy of tonsils: Secondary | ICD-10-CM | POA: Diagnosis not present

## 2023-07-23 DIAGNOSIS — J039 Acute tonsillitis, unspecified: Secondary | ICD-10-CM

## 2023-07-23 MED ORDER — METHYLPREDNISOLONE 4 MG PO TBPK: ORAL_TABLET | ORAL | 0 refills | Status: AC

## 2023-07-23 MED ORDER — PENICILLIN V POTASSIUM 500 MG PO TABS: 500.0000 mg | ORAL_TABLET | Freq: Three times a day (TID) | ORAL | 0 refills | Status: AC

## 2023-07-23 NOTE — Progress Notes (Signed)
 Virtual Visit Consent   Julie Wood, you are scheduled for a virtual visit with a [facility] provider today. Just as with appointments in the office, your consent must be obtained to participate. Your consent will be active for this visit and any virtual visit you may have with one of our providers in the next 365 days. If you have a MyChart account, a copy of this consent can be sent to you electronically.  As this is a virtual visit, video technology does not allow for your provider to perform a traditional examination. This may limit your provider's ability to fully assess your condition. If your provider identifies any concerns that need to be evaluated in person or the need to arrange testing (such as labs, EKG, etc.), we will make arrangements to do so. Although advances in technology are sophisticated, we cannot ensure that it will always work on either your end or our end. If the connection with a video visit is poor, the visit may have to be switched to a telephone visit. With either a video or telephone visit, we are not always able to ensure that we have a secure connection.  By engaging in this virtual visit, you consent to the provision of healthcare and authorize for your insurance to be billed (if applicable) for the services provided during this visit. Depending on your insurance coverage, you may receive a charge related to this service.  I need to obtain your verbal consent now. Are you willing to proceed with your visit today? Julie Wood has provided verbal consent on 07/23/2023 for a virtual visit (video or telephone). Julie Gutta, PA-C  Date: 07/23/2023 6:45 PM   Virtual Visit via Video Note   I, Julie Wood, connected with  Julie Wood  ([id number], [birth year]) on 07/23/23 at  7:00 PM EDT by a video-enabled telemedicine application and verified that I am speaking with the correct person using two identifiers.  Location: Patient: Virtual Visit Location Patient:  Home Provider: Virtual Visit Location Provider: Home Office   I discussed the limitations of evaluation and management by telemedicine and the availability of in person appointments. The patient expressed understanding and agreed to proceed.    History of Present Illness: Julie Wood is a 18 y.o. who identifies as a female who was assigned female at birth, and is being seen today for sore throat.  HPI: 18y/o F presents for a telehealth video visit for c/o white patches on the back of the throat and enlarged tonsils. Low grade fever of 99.7 degrees F. Painful to swallow solids and liquids. No known exposure to strep, but h/o multiple tonsillar infections. She also c/o feeling fatigued.    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   Seasonal allergic conjunctivitis 03/25/2018   Allergic rhinitis 05/15/2013   Other atopic dermatitis 05/15/2013    Allergies:  Allergies  Allergen Reactions   Peanut-Containing Drug Products    Citrus    Other     Tree nuts Raw potato -hives with contact exposure Melons   Shellfish Allergy     Medications:  Current Outpatient Medications:    methylPREDNISolone (MEDROL DOSEPAK) 4 MG TBPK tablet, Take as directed, Disp: 1 each, Rfl: 0   penicillin v potassium (VEETID) 500 MG tablet, Take 1 tablet (500 mg total) by mouth 3 (three) times daily for 10 days.,  Disp: 30 tablet, Rfl: 0   busPIRone (BUSPAR) 7.5 MG tablet, Take 7.5 mg by mouth 2 (two) times daily., Disp: , Rfl:    cetirizine  (ZYRTEC ) 10 MG tablet, Take 1 tablet daily as needed for runny nose or itching, Disp: 30 tablet, Rfl: 5   CONCERTA 27 MG CR tablet, , Disp: , Rfl:    Crisaborole  (EUCRISA ) 2 % OINT, Apply 1 application  topically 2 (two) times daily as needed (eczema flare)., Disp: 60 g, Rfl: 5   cromolyn  (OPTICROM ) 4 %  ophthalmic solution, Place 1 drop into both eyes 4 (four) times daily as needed (watery itchy eyes)., Disp: 10 mL, Rfl: 5   DUPIXENT  300 MG/2ML prefilled syringe, INJECT 300MG  SUBCUTANEOUSLY  EVERY OTHER WEEK, Disp: 4 mL, Rfl: 11   EPINEPHrine  0.3 mg/0.3 mL IJ SOAJ injection, Inject 0.3 mg into the muscle as needed for anaphylaxis., Disp: 2 each, Rfl: 1   fluconazole  (DIFLUCAN ) 150 MG tablet, Take 1 tablet (150 mg total) by mouth every three (3) days as needed., Disp: 2 tablet, Rfl: 0   fluticasone  (FLONASE ) 50 MCG/ACT nasal spray, Place 1 spray into both nostrils 2 (two) times daily., Disp: , Rfl:    mometasone  (ELOCON ) 0.1 % ointment, For stubborn red itchy areas below your face, begin mometasome once a day as needed.  Do not use this medication for longer than 2 weeks in a row., Disp: 45 g, Rfl: 0   montelukast  (SINGULAIR ) 10 MG tablet, Take 1 tablet (10 mg total) by mouth at bedtime., Disp: 30 tablet, Rfl: 5   Olopatadine  HCl (PATADAY ) 0.2 % SOLN, Place 1 drop into both eyes daily. Use as needed for itchy water eyes, Disp: 2.5 mL, Rfl: 5   predniSONE  (STERAPRED UNI-PAK 21 TAB) 10 MG (21) TBPK tablet, Take as directed (Patient not taking: Reported on 03/03/2023), Disp: 21 tablet, Rfl: 0   tacrolimus  (PROTOPIC ) 0.1 % ointment, APPLY TOPICALLY TO RED AND ITCHY AREAS UP TO TWICE A DAY, Disp: 100 g, Rfl: 0   triamcinolone  (NASACORT ) 55 MCG/ACT AERO nasal inhaler, Place 2 sprays into the nose daily as needed (nasal congestion)., Disp: 1 each, Rfl: 5   triamcinolone  ointment (KENALOG ) 0.1 %, Apply twice daily for flare ups below neck, maximum 10 days., Disp: 80 g, Rfl: 5  Observations/Objective: Patient is well-developed, well-nourished in no acute distress.  Resting comfortably  at home.  Head is normocephalic, atraumatic.  No labored breathing.  Speech is clear and coherent with logical content.  Patient is alert and oriented at baseline.    Assessment and Plan: 1. Enlarged tonsils (Primary) -  methylPREDNISolone (MEDROL DOSEPAK) 4 MG TBPK tablet; Take as directed  Dispense: 1 each; Refill: 0  2. Tonsillitis - penicillin v potassium (VEETID) 500 MG tablet; Take 1 tablet (500 mg total) by mouth 3 (three) times daily for 10 days.  Dispense: 30 tablet; Refill: 0  3. Pharyngitis, unspecified etiology - penicillin v potassium (VEETID) 500 MG tablet; Take 1 tablet (500 mg total) by mouth 3 (three) times daily for 10 days.  Dispense: 30 tablet; Refill: 0  Stay well hydrated with clear liquids Start medicine as prescribed. May take Tylenol for fever as directed on the box. Continue to watch for worsening symptoms. Schedule a virtual appointment or follow up at an urgent care clinic if symptoms don't improve.  Pt verbalized understanding and in agreement.    Follow Up Instructions: I discussed the assessment and treatment plan with the patient. The patient was provided an  opportunity to ask questions and all were answered. The patient agreed with the plan and demonstrated an understanding of the instructions.  A copy of instructions were sent to the patient via MyChart unless otherwise noted below.   Patient has requested to receive PHI (AVS, Work Notes, etc) pertaining to this video visit through e-mail as they are currently without active MyChart. They have voiced understand that email is not considered secure and their health information could be viewed by someone other than the patient.   The patient was advised to call back or seek an in-person evaluation if the symptoms worsen or if the condition fails to improve as anticipated.    Kameshia Madruga, PA-C

## 2023-07-23 NOTE — Patient Instructions (Signed)
 Julie Wood, thank you for joining [name], PA-C for today's virtual visit.  While this provider is not your primary care provider (PCP), if your PCP is located in our provider database this encounter information will be shared with them immediately following your visit.   A [facility] MyChart account gives you access to today's visit and all your visits, tests, and labs performed at [facility] " click here if you don't have a [facility] MyChart account or go to [email or web address]  Consent: (Patient) Julie Wood provided verbal consent for this virtual visit at the beginning of the encounter.  Current Medications:  Current Outpatient Medications:    methylPREDNISolone (MEDROL DOSEPAK) 4 MG TBPK tablet, Take as directed, Disp: 1 each, Rfl: 0   penicillin v potassium (VEETID) 500 MG tablet, Take 1 tablet (500 mg total) by mouth 3 (three) times daily for 10 days., Disp: 30 tablet, Rfl: 0   busPIRone (BUSPAR) 7.5 MG tablet, Take 7.5 mg by mouth 2 (two) times daily., Disp: , Rfl:    cetirizine  (ZYRTEC ) 10 MG tablet, Take 1 tablet daily as needed for runny nose or itching, Disp: 30 tablet, Rfl: 5   CONCERTA 27 MG CR tablet, , Disp: , Rfl:    Crisaborole  (EUCRISA ) 2 % OINT, Apply 1 application  topically 2 (two) times daily as needed (eczema flare)., Disp: 60 g, Rfl: 5   cromolyn  (OPTICROM ) 4 % ophthalmic solution, Place 1 drop into both eyes 4 (four) times daily as needed (watery itchy eyes)., Disp: 10 mL, Rfl: 5   DUPIXENT  300 MG/2ML prefilled syringe, INJECT 300MG  SUBCUTANEOUSLY  EVERY OTHER WEEK, Disp: 4 mL, Rfl: 11   EPINEPHrine  0.3 mg/0.3 mL IJ SOAJ injection, Inject 0.3 mg into the muscle as needed for anaphylaxis., Disp: 2 each, Rfl: 1   fluconazole  (DIFLUCAN ) 150 MG tablet, Take 1 tablet (150 mg total) by mouth every three (3) days as needed., Disp: 2 tablet, Rfl: 0   fluticasone  (FLONASE ) 50 MCG/ACT nasal spray, Place 1 spray into both nostrils 2 (two)  times daily., Disp: , Rfl:    mometasone  (ELOCON ) 0.1 % ointment, For stubborn red itchy areas below your face, begin mometasome once a day as needed.  Do not use this medication for longer than 2 weeks in a row., Disp: 45 g, Rfl: 0   montelukast  (SINGULAIR ) 10 MG tablet, Take 1 tablet (10 mg total) by mouth at bedtime., Disp: 30 tablet, Rfl: 5   Olopatadine  HCl (PATADAY ) 0.2 % SOLN, Place 1 drop into both eyes daily. Use as needed for itchy water eyes, Disp: 2.5 mL, Rfl: 5   predniSONE  (STERAPRED UNI-PAK 21 TAB) 10 MG (21) TBPK tablet, Take as directed (Patient not taking: Reported on 03/03/2023), Disp: 21 tablet, Rfl: 0   tacrolimus  (PROTOPIC ) 0.1 % ointment, APPLY TOPICALLY TO RED AND ITCHY AREAS UP TO TWICE A DAY, Disp: 100 g, Rfl: 0   triamcinolone  (NASACORT ) 55 MCG/ACT AERO nasal inhaler, Place 2 sprays into the nose daily as needed (nasal congestion)., Disp: 1 each, Rfl: 5   triamcinolone  ointment (KENALOG ) 0.1 %, Apply twice daily for flare ups below neck, maximum 10 days., Disp: 80 g, Rfl: 5   Medications ordered in this encounter:  Meds ordered this encounter  Medications   penicillin v potassium (VEETID) 500 MG tablet    Sig: Take 1 tablet (500 mg total) by mouth 3 (three) times daily for 10 days.    Dispense:  30 tablet    Refill:  0  Supervising Provider:   Corine Dice [[id number]]   methylPREDNISolone (MEDROL DOSEPAK) 4 MG TBPK tablet    Sig: Take as directed    Dispense:  1 each    Refill:  0    Supervising Provider:   Corine Dice [[id number]]     *If you need refills on other medications prior to your next appointment, please contact your pharmacy*  Follow-Up: Call back or seek an in-person evaluation if the symptoms worsen or if the condition fails to improve as anticipated.  [facility] [phone]  Other Instructions Stay well hydrated with clear liquids Start medicine as prescribed. May take Tylenol for fever as directed on the  box. Continue to watch for worsening symptoms. Schedule a virtual appointment or follow up at an urgent care clinic if symptoms don't improve.    If you have been instructed to have an in-person evaluation today at a local Urgent Care facility, please use the link below. It will take you to a list of all of our available [facility], including address, phone number and hours of operation. Please do not delay care.  [facility]  If you or a family member do not have a primary care provider, use the link below to schedule a visit and establish care. When you choose a [facility] primary care physician or advanced practice provider, you gain a long-term partner in health. Find a Primary Care Provider  Learn more about [facility]'s in-office and virtual care options:  - Get Care Now

## 2023-08-05 ENCOUNTER — Other Ambulatory Visit: Payer: Self-pay | Admitting: Allergy

## 2023-09-08 ENCOUNTER — Encounter: Payer: Self-pay | Admitting: Family Medicine

## 2023-09-08 ENCOUNTER — Telehealth: Admitting: Family Medicine

## 2023-09-08 DIAGNOSIS — K5900 Constipation, unspecified: Secondary | ICD-10-CM

## 2023-09-08 NOTE — Progress Notes (Signed)
 Patient not in [place]- currently in [place]  visit. I have advised that legally we do not see patients outside the designated states for [facility].  She will have to be seen in person where she is located at.  Patient acknowledged agreement and understanding of the plan.

## 2023-09-10 ENCOUNTER — Telehealth: Payer: Self-pay | Admitting: Allergy

## 2023-09-10 NOTE — Telephone Encounter (Signed)
 Left voicemail to give the office a call back to schedule Dupixent reapproval appointment.

## 2023-09-12 ENCOUNTER — Telehealth: Admitting: Family Medicine

## 2023-09-12 DIAGNOSIS — J0100 Acute maxillary sinusitis, unspecified: Secondary | ICD-10-CM | POA: Diagnosis not present

## 2023-09-12 DIAGNOSIS — B3731 Acute candidiasis of vulva and vagina: Secondary | ICD-10-CM | POA: Diagnosis not present

## 2023-09-12 DIAGNOSIS — K5900 Constipation, unspecified: Secondary | ICD-10-CM

## 2023-09-12 MED ORDER — POLYETHYLENE GLYCOL 3350 17 GM/SCOOP PO POWD: 17.0000 g | Freq: Two times a day (BID) | ORAL | 0 refills | Status: AC | PRN

## 2023-09-12 MED ORDER — FLUCONAZOLE 150 MG PO TABS: 150.0000 mg | ORAL_TABLET | ORAL | 0 refills | Status: AC | PRN

## 2023-09-12 MED ORDER — AMOXICILLIN-POT CLAVULANATE 875-125 MG PO TABS
1.0000 | ORAL_TABLET | Freq: Two times a day (BID) | ORAL | 0 refills | Status: AC
Start: 2023-09-12 — End: ?

## 2023-09-12 NOTE — Progress Notes (Signed)
 Virtual Visit Consent   Julie Wood, you are scheduled for a virtual visit with a  provider today. Just as with appointments in the office, your consent must be obtained to participate. Your consent will be active for this visit and any virtual visit you may have with one of our providers in the next 365 days. If you have a MyChart account, a copy of this consent can be sent to you electronically.  As this is a virtual visit, video technology does not allow for your provider to perform a traditional examination. This may limit your provider's ability to fully assess your condition. If your provider identifies any concerns that need to be evaluated in person or the need to arrange testing (such as labs, EKG, etc.), we will make arrangements to do so. Although advances in technology are sophisticated, we cannot ensure that it will always work on either your end or our end. If the connection with a video visit is poor, the visit may have to be switched to a telephone visit. With either a video or telephone visit, we are not always able to ensure that we have a secure connection.  By engaging in this virtual visit, you consent to the provision of healthcare and authorize for your insurance to be billed (if applicable) for the services provided during this visit. Depending on your insurance coverage, you may receive a charge related to this service.  I need to obtain your verbal consent now. Are you willing to proceed with your visit today? Julie Wood has provided verbal consent on 09/12/2023 for a virtual visit (video or telephone). Loa Lamp, FNP  Date: 09/12/2023 3:08 PM   Virtual Visit via Video Note   I, Loa Lamp, connected with  Julie Wood  ([id number], 2005/09/15) on 09/12/23 at  3:15 PM EDT by a video-enabled telemedicine application and verified that I am speaking with the correct person using two identifiers.  Location: Patient: Virtual Visit Location Patient:  Home Provider: Virtual Visit Location Provider: Home Office   I discussed the limitations of evaluation and management by telemedicine and the availability of in person appointments. The patient expressed understanding and agreed to proceed.    History of Present Illness: Julie Wood is a 18 y.o. who identifies as a female who was assigned female at birth, and is being seen today for sinus pressure and pain for over a week after trip to [place] , drainage, ear aches, fever, flu and covid neg in [place], mucus is thick and green, sx worsening. Also requests a refill on miralax but knows not to take with augmentin due to risk or diarrhea. Also requests diflucan  for yeast from [facility].   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   Seasonal allergic conjunctivitis 03/25/2018   Allergic rhinitis 05/15/2013   Other atopic dermatitis 05/15/2013    Allergies:  Allergies  Allergen Reactions   Peanut-Containing Drug Products    Citrus    Other     Tree nuts Raw potato -hives with contact exposure Melons   Shellfish Allergy     Medications:  Current Outpatient Medications:    busPIRone (BUSPAR) 7.5 MG tablet, Take 7.5 mg by mouth 2 (two) times daily., Disp: , Rfl:    cetirizine  (ZYRTEC ) 10 MG tablet, Take 1 tablet daily as needed for runny nose or itching, Disp: 30 tablet, Rfl: 5  CONCERTA 27 MG CR tablet, , Disp: , Rfl:    Crisaborole  (EUCRISA ) 2 % OINT, Apply 1 application  topically 2 (two) times daily as needed (eczema flare)., Disp: 60 g, Rfl: 5   cromolyn  (OPTICROM ) 4 % ophthalmic solution, Place 1 drop into both eyes 4 (four) times daily as needed (watery itchy eyes)., Disp: 10 mL, Rfl: 5   dupilumab  (DUPIXENT ) 300 MG/2ML prefilled syringe, INJECT 1 SYRINGE SUBCUTANEOUSLY  EVERY OTHER WEEK, Disp: 4 mL, Rfl: 11   EPINEPHrine  0.3  mg/0.3 mL IJ SOAJ injection, Inject 0.3 mg into the muscle as needed for anaphylaxis., Disp: 2 each, Rfl: 1   fluconazole  (DIFLUCAN ) 150 MG tablet, Take 1 tablet (150 mg total) by mouth every three (3) days as needed., Disp: 2 tablet, Rfl: 0   fluticasone  (FLONASE ) 50 MCG/ACT nasal spray, Place 1 spray into both nostrils 2 (two) times daily., Disp: , Rfl:    methylPREDNISolone  (MEDROL  DOSEPAK) 4 MG TBPK tablet, Take as directed, Disp: 1 each, Rfl: 0   mometasone  (ELOCON ) 0.1 % ointment, For stubborn red itchy areas below your face, begin mometasome once a day as needed.  Do not use this medication for longer than 2 weeks in a row., Disp: 45 g, Rfl: 0   montelukast  (SINGULAIR ) 10 MG tablet, Take 1 tablet (10 mg total) by mouth at bedtime., Disp: 30 tablet, Rfl: 5   Olopatadine  HCl (PATADAY ) 0.2 % SOLN, Place 1 drop into both eyes daily. Use as needed for itchy water eyes, Disp: 2.5 mL, Rfl: 5   predniSONE  (STERAPRED UNI-PAK 21 TAB) 10 MG (21) TBPK tablet, Take as directed (Patient not taking: Reported on 03/03/2023), Disp: 21 tablet, Rfl: 0   tacrolimus  (PROTOPIC ) 0.1 % ointment, APPLY TOPICALLY TO RED AND ITCHY AREAS UP TO TWICE A DAY, Disp: 100 g, Rfl: 0   triamcinolone  (NASACORT ) 55 MCG/ACT AERO nasal inhaler, Place 2 sprays into the nose daily as needed (nasal congestion)., Disp: 1 each, Rfl: 5   triamcinolone  ointment (KENALOG ) 0.1 %, Apply twice daily for flare ups below neck, maximum 10 days., Disp: 80 g, Rfl: 5  Observations/Objective: Patient is well-developed, well-nourished in no acute distress.  Resting comfortably  at home.  Head is normocephalic, atraumatic.  No labored breathing.  Speech is clear and coherent with logical content.  Patient is alert and oriented at baseline.    Assessment and Plan: 1. Acute non-recurrent maxillary sinusitis (Primary)  Increase fluids humidifier at night, tylenol or ibuprofen , taking dayquil and nyquil. UC as needed.   Follow Up Instructions: I  discussed the assessment and treatment plan with the patient. The patient was provided an opportunity to ask questions and all were answered. The patient agreed with the plan and demonstrated an understanding of the instructions.  A copy of instructions were sent to the patient via MyChart unless otherwise noted below.     The patient was advised to call back or seek an in-person evaluation if the symptoms worsen or if the condition fails to improve as anticipated.    Natanael Saladin, FNP

## 2023-09-12 NOTE — Patient Instructions (Signed)

## 2023-09-29 ENCOUNTER — Ambulatory Visit (INDEPENDENT_AMBULATORY_CARE_PROVIDER_SITE_OTHER): Admitting: Family Medicine

## 2023-09-29 ENCOUNTER — Encounter: Payer: Self-pay | Admitting: Family Medicine

## 2023-09-29 ENCOUNTER — Other Ambulatory Visit: Payer: Self-pay

## 2023-09-29 VITALS — BP 124/70 | HR 90 | Temp 98.0°F | Resp 18 | Ht 66.0 in | Wt 195.9 lb

## 2023-09-29 DIAGNOSIS — J302 Other seasonal allergic rhinitis: Secondary | ICD-10-CM | POA: Diagnosis not present

## 2023-09-29 DIAGNOSIS — H1013 Acute atopic conjunctivitis, bilateral: Secondary | ICD-10-CM

## 2023-09-29 DIAGNOSIS — L2089 Other atopic dermatitis: Secondary | ICD-10-CM | POA: Diagnosis not present

## 2023-09-29 DIAGNOSIS — T7800XD Anaphylactic reaction due to unspecified food, subsequent encounter: Secondary | ICD-10-CM

## 2023-09-29 DIAGNOSIS — H1010 Acute atopic conjunctivitis, unspecified eye: Secondary | ICD-10-CM

## 2023-09-29 DIAGNOSIS — J3089 Other allergic rhinitis: Secondary | ICD-10-CM | POA: Diagnosis not present

## 2023-09-29 MED ORDER — TRIAMCINOLONE ACETONIDE 55 MCG/ACT NA AERO: 2.0000 | INHALATION_SPRAY | Freq: Every day | NASAL | 5 refills | Status: AC | PRN

## 2023-09-29 MED ORDER — CROMOLYN SODIUM 4 % OP SOLN: 1.0000 [drp] | Freq: Four times a day (QID) | OPHTHALMIC | 5 refills | Status: AC | PRN

## 2023-09-29 MED ORDER — MONTELUKAST SODIUM 10 MG PO TABS: 10.0000 mg | ORAL_TABLET | Freq: Every day | ORAL | 5 refills | Status: AC

## 2023-09-29 MED ORDER — TACROLIMUS 0.1 % EX OINT: TOPICAL_OINTMENT | CUTANEOUS | 0 refills | Status: DC

## 2023-09-29 MED ORDER — MOMETASONE FUROATE 0.1 % EX OINT: TOPICAL_OINTMENT | CUTANEOUS | 0 refills | Status: AC

## 2023-09-29 NOTE — Progress Notes (Signed)
 [address] [place] [address] Dept: [phone]  FOLLOW UP NOTE  Patient ID: Julie Wood, female    DOB: Sep 22, 2005  Age: 18 y.o. MRN: [id number] Date of Office Visit: 09/29/2023  Assessment  Chief Complaint: Rash (Two days - work up with it itches/ burns - used Vaseline )  HPI Julie Wood is a 18 year old female who presents to the clinic for a follow-up visit.  She was last seen in this clinic on 03/03/2023 by Arlean Mutter, FNP, for evaluation of allergic rhinitis, allergic conjunctivitis, atopic dermatitis, and food allergy  to peanuts, tree nuts, citrus, and shellfish.  She is accompanied by her mother who assists with history.  At today's visit, she reports her allergic rhinitis has been poorly controlled with symptoms including clear rhinorrhea, nasal congestion, sneezing, and postnasal drainage.  She reports that she has previously taken montelukast , a daily antihistamine, and Nasacort , however, she reports that she is out of these medications at this time. She originally began allergen immunotherapy on 07/29/2017, however, she has been very inconsistent with receiving allergen immunotherapy.  Chart review indicates allergen immunotherapy has been restarted 6 times with her last injection on 07/01/2022.  The patient is interested in restarting allergen immunotherapy.  We discussed updating her allergy  testing possibly initiating oral immunotherapy directed toward Timothy grass, short ragweed, or dust mite.   Allergic conjunctivitis is reported as moderately well-controlled with occasional red and itchy eyes for which she continues cromolyn  eyedrops with relief of symptoms.  Atopic dermatitis is reported as moderately well-controlled with occasional breakouts occurring mainly on her face.  She continues a daily moisturizer, Protopic  and mometasone  as needed, however, reports that she is currently out of these medications.  She continues to self administer Dupixent  300 mg injections  once every 2 weeks with no large or local reactions.  She reports a significant decrease in her symptoms of atopic dermatitis while continuing on Dupixent .  She continues to avoid peanuts, tree nuts, citrus, and shellfish with no accidental ingestion or EpiPen  use since her last visit to this clinic. Her last food allergy  skin testing on 07/08/2022 was negative to peanuts, tree nuts, orange, and shellfish.  Mom reports that when she consumes these foods she experiences throat closing. EpiPen  set is up-to-date.  Her current medications are listed in the chart.  Drug Allergies:  Allergies  Allergen Reactions   Peanut-Containing Drug Products    Citrus    Other     Tree nuts Raw potato -hives with contact exposure Melons   Shellfish Allergy      Physical Exam: BP 124/70 (BP Location: Right Arm, Patient Position: Sitting, Cuff Size: Normal)   Pulse 90   Temp 98 F (36.7 C) (Temporal)   Resp 18   Ht 5' 6 (1.676 m)   Wt 195 lb 14.4 oz (88.9 kg)   SpO2 97%   BMI 31.62 kg/m    Physical Exam Vitals reviewed.  Constitutional:      Appearance: Normal appearance.  HENT:     Head: Normocephalic and atraumatic.     Right Ear: Tympanic membrane normal.     Left Ear: Tympanic membrane normal.     Nose:     Comments: Bilateral nares edematous and pale with thin clear nasal drainage noted.  Pharynx normal.  Ears normal.  Eyes normal.    Mouth/Throat:     Pharynx: Oropharynx is clear.  Eyes:     Conjunctiva/sclera: Conjunctivae normal.  Cardiovascular:     Rate and Rhythm: Normal  rate and regular rhythm.     Heart sounds: Normal heart sounds. No murmur heard. Pulmonary:     Effort: Pulmonary effort is normal.     Breath sounds: Normal breath sounds.     Comments: Lungs clear to auscultation Musculoskeletal:        General: Normal range of motion.     Cervical back: Normal range of motion and neck supple.  Skin:    General: Skin is warm and dry.     Comments: Red eczematous areas  noted on bilateral cheeks.  No open areas or drainage noted.  Neurological:     Mental Status: She is alert and oriented to person, place, and time.  Psychiatric:        Mood and Affect: Mood normal.        Behavior: Behavior normal.        Thought Content: Thought content normal.        Judgment: Judgment normal.     Assessment and Plan: 1. Seasonal and perennial allergic rhinitis   2. Seasonal allergic conjunctivitis   3. Other atopic dermatitis   4. Anaphylactic reaction due to food, subsequent encounter     Meds ordered this encounter  Medications   mometasone  (ELOCON ) 0.1 % ointment    Sig: For stubborn red itchy areas below your face, begin mometasome once a day as needed.  Do not use this medication for longer than 2 weeks in a row.    Dispense:  45 g    Refill:  0   montelukast  (SINGULAIR ) 10 MG tablet    Sig: Take 1 tablet (10 mg total) by mouth at bedtime.    Dispense:  30 tablet    Refill:  5   cromolyn  (OPTICROM ) 4 % ophthalmic solution    Sig: Place 1 drop into both eyes 4 (four) times daily as needed (watery itchy eyes).    Dispense:  10 mL    Refill:  5   tacrolimus  (PROTOPIC ) 0.1 % ointment    Sig: Apply topically to red and itchy areas up to twice a day    Dispense:  100 g    Refill:  0   triamcinolone  (NASACORT ) 55 MCG/ACT AERO nasal inhaler    Sig: Place 2 sprays into the nose daily as needed (nasal congestion).    Dispense:  1 each    Refill:  5    Patient Instructions  Allergic rhinitis Continue allergen avoidance measures directed toward grass pollen, weed pollen, ragweed pollen, tree pollen, mold, dust mite, cat, and dog as listed below Continue an antihistamine once a day as needed for runny nose or itch. Remember to rotate to a different antihistamine about every 3 months. Some examples of over the counter antihistamines include Zyrtec  (cetirizine ), Xyzal  (levocetirizine), Allegra  (fexofenadine ), and Claritin (loratidine).  Continue montelukast   10 mg once a day for allergy  symptom control Continue Nasacort  1 to 2 sprays in each nostril once a day as needed for a stuffy nose Consider saline nasal rinses as needed for nasal symptoms. Use this before any medicated nasal sprays for best result Return to the clinic or any [facility] to get the allergy  testing. We will call you when the results become available. We will call you when the results become available If positive to Timothy grass, short ragweed, or dust mites, consider oral immunotherapy.  Allergic conjunctivitis Continue cromolyn  1 to 2 drops in each eye up to 4 times a day if needed for breakthrough hives  Atopic  dermatitis Continue a twice a day moisturizing routine Restart Protopic  to red and itchy areas up to twice a day for red or itchy areas For stubborn red itchy areas below your face, begin mometasone  once a day as needed.  Do not use this medication for longer than 2 weeks in a row. This will replace triamcinolone   Food allergy   Continue to avoid peanuts, tree nuts, citrus, and shellfish. In case of an allergic reaction, give Benadryl  50 mg every 4 hours, and if life-threatening symptoms occur, inject with EpiPen  0.3 mg. A lab has been ordered to help us  evaluate your food allergies.  We will call you when the labs become available.  Call the clinic if this treatment plan is not working well for you.  Follow up in 3 months or sooner if needed.  Return in about 3 months (around 12/30/2023), or if symptoms worsen or fail to improve.    Thank you for the opportunity to care for this patient.  Please do not hesitate to contact me with questions.  Arlean Mutter, FNP [facility] 

## 2023-09-29 NOTE — Patient Instructions (Addendum)
 Allergic rhinitis Continue allergen avoidance measures directed toward grass pollen, weed pollen, ragweed pollen, tree pollen, mold, dust mite, cat, and dog as listed below Continue an antihistamine once a day as needed for runny nose or itch. Remember to rotate to a different antihistamine about every 3 months. Some examples of over the counter antihistamines include Zyrtec  (cetirizine ), Xyzal  (levocetirizine), Allegra  (fexofenadine ), and Claritin (loratidine).  Continue montelukast  10 mg once a day for allergy  symptom control Continue Nasacort  1 to 2 sprays in each nostril once a day as needed for a stuffy nose Consider saline nasal rinses as needed for nasal symptoms. Use this before any medicated nasal sprays for best result Return to the clinic or any [facility] to get the allergy  testing. We will call you when the results become available. We will call you when the results become available If positive to Timothy grass, short ragweed, or dust mites, consider oral immunotherapy.  Allergic conjunctivitis Continue cromolyn  1 to 2 drops in each eye up to 4 times a day if needed for breakthrough hives  Atopic dermatitis Continue a twice a day moisturizing routine Restart Protopic  to red and itchy areas up to twice a day for red or itchy areas For stubborn red itchy areas below your face, begin mometasone  once a day as needed.  Do not use this medication for longer than 2 weeks in a row. This will replace triamcinolone   Food allergy   Continue to avoid peanuts, tree nuts, citrus, and shellfish. In case of an allergic reaction, give Benadryl  50 mg every 4 hours, and if life-threatening symptoms occur, inject with EpiPen  0.3 mg. A lab has been ordered to help us  evaluate your food allergies.  We will call you when the labs become available.  Call the clinic if this treatment plan is not working well for you.  Follow up in 3 months or sooner if needed.  Reducing Pollen Exposure The American Academy  of Allergy , Asthma and Immunology suggests the following steps to reduce your exposure to pollen during allergy  seasons. Do not hang sheets or clothing out to dry; pollen may collect on these items. Do not mow lawns or spend time around freshly cut grass; mowing stirs up pollen. Keep windows closed at night.  Keep car windows closed while driving. Minimize morning activities outdoors, a time when pollen counts are usually at their highest. Stay indoors as much as possible when pollen counts or humidity is high and on windy days when pollen tends to remain in the air longer. Use air conditioning when possible.  Many air conditioners have filters that trap the pollen spores. Use a HEPA room air filter to remove pollen form the indoor air you breathe.  Control of Mold Allergen Mold and fungi can grow on a variety of surfaces provided certain temperature and moisture conditions exist.  Outdoor molds grow on plants, decaying vegetation and soil.  The major outdoor mold, Alternaria and Cladosporium, are found in very high numbers during hot and dry conditions.  Generally, a late Summer - Fall peak is seen for common outdoor fungal spores.  Rain will temporarily lower outdoor mold spore count, but counts rise rapidly when the rainy period ends.  The most important indoor molds are Aspergillus and Penicillium.  Dark, humid and poorly ventilated basements are ideal sites for mold growth.  The next most common sites of mold growth are the bathroom and the kitchen.  Outdoor [employer] Use air conditioning and keep windows closed Avoid exposure to decaying vegetation.  Avoid leaf raking. Avoid grain handling. Consider wearing a face mask if working in moldy areas.  Indoor Mold Control Maintain humidity below 50%. Clean washable surfaces with 5% bleach solution. Remove sources e.g. Contaminated carpets.   Control of Dust Mite Allergen Dust mites play a major role in allergic asthma and rhinitis. They  occur in environments with high humidity wherever human skin is found. Dust mites absorb humidity from the atmosphere (ie, they do not drink) and feed on organic matter (including shed human and animal skin). Dust mites are a microscopic type of insect that you cannot see with the naked eye. High levels of dust mites have been detected from mattresses, pillows, carpets, upholstered furniture, bed covers, clothes, soft toys and any woven material. The principal allergen of the dust mite is found in its feces. A gram of dust may contain 1,000 mites and 250,000 fecal particles. Mite antigen is easily measured in the air during house cleaning activities. Dust mites do not bite and do not cause harm to humans, other than by triggering allergies/asthma.  Ways to decrease your exposure to dust mites in your home:  1. Encase mattresses, box springs and pillows with a mite-impermeable barrier or cover  2. Wash sheets, blankets and drapes weekly in hot water (130 F) with detergent and dry them in a dryer on the hot setting.  3. Have the room cleaned frequently with a vacuum cleaner and a damp dust-mop. For carpeting or rugs, vacuuming with a vacuum cleaner equipped with a high-efficiency particulate air (HEPA) filter. The dust mite allergic individual should not be in a room which is being cleaned and should wait 1 hour after cleaning before going into the room.  4. Do not sleep on upholstered furniture (eg, couches).  5. If possible removing carpeting, upholstered furniture and drapery from the home is ideal. Horizontal blinds should be eliminated in the rooms where the person spends the most time (bedroom, study, television room). Washable vinyl, roller-type shades are optimal.  6. Remove all non-washable stuffed toys from the bedroom. Wash stuffed toys weekly like sheets and blankets above.  7. Reduce indoor humidity to less than 50%. Inexpensive humidity monitors can be purchased at most hardware stores.  Do not use a humidifier as can make the problem worse and are not recommended.  Control of Dog or Cat Allergen Avoidance is the best way to manage a dog or cat allergy . If you have a dog or cat and are allergic to dog or cats, consider removing the dog or cat from the home. If you have a dog or cat but don't want to find it a new home, or if your family wants a pet even though someone in the household is allergic, here are some strategies that may help keep symptoms at bay:  Keep the pet out of your bedroom and restrict it to only a few rooms. Be advised that keeping the dog or cat in only one room will not limit the allergens to that room. Don't pet, hug or kiss the dog or cat; if you do, wash your hands with soap and water. High-efficiency particulate air (HEPA) cleaners run continuously in a bedroom or living room can reduce allergen levels over time. Regular use of a high-efficiency vacuum cleaner or a central vacuum can reduce allergen levels. Giving your dog or cat a bath at least once a week can reduce airborne allergen.

## 2023-09-30 ENCOUNTER — Ambulatory Visit (INDEPENDENT_AMBULATORY_CARE_PROVIDER_SITE_OTHER)

## 2023-09-30 DIAGNOSIS — L209 Atopic dermatitis, unspecified: Secondary | ICD-10-CM

## 2023-09-30 MED ORDER — DUPILUMAB 300 MG/2ML ~~LOC~~ SOSY
300.0000 mg | PREFILLED_SYRINGE | Freq: Once | SUBCUTANEOUS | Status: AC
  Administered 2023-09-30 (×2): 300 mg via SUBCUTANEOUS

## 2023-09-30 NOTE — Progress Notes (Deleted)
 Immunotherapy   Patient Details  Name: Julie Wood MRN: [id number] Date of Birth: 09-14-2005  09/30/2023  [name] started injections for eczema. Patient received a loading dose of 600 mg Dupixent  and waited 15 minutes with no problems.   Frequency: every 14 days Consent signed and patient instructions given.   [name] 09/30/2023, 5:04 PM

## 2023-10-30 ENCOUNTER — Other Ambulatory Visit: Payer: Self-pay | Admitting: Family Medicine

## 2024-01-07 ENCOUNTER — Encounter

## 2024-01-08 ENCOUNTER — Telehealth: Admitting: Physician Assistant

## 2024-01-08 DIAGNOSIS — L700 Acne vulgaris: Secondary | ICD-10-CM | POA: Diagnosis not present

## 2024-01-08 MED ORDER — TRIAMCINOLONE ACETONIDE 0.1 % EX CREA: 1.0000 | TOPICAL_CREAM | Freq: Two times a day (BID) | CUTANEOUS | 0 refills | Status: AC

## 2024-01-08 MED ORDER — BENZOYL PEROXIDE-ERYTHROMYCIN 5-3 % EX GEL: Freq: Two times a day (BID) | CUTANEOUS | 0 refills | Status: AC

## 2024-01-08 NOTE — Patient Instructions (Signed)
 [name], thank you for joining Teena Shuck, PA-C for today's virtual visit.  While this provider is not your primary care provider (PCP), if your PCP is located in our provider database this encounter information will be shared with them immediately following your visit.   A [facility] MyChart account gives you access to today's visit and all your visits, tests, and labs performed at [facility]  click here if you don't have a Portal MyChart account or go to [email or web address]  Consent: (Patient) Julie Wood provided verbal consent for this virtual visit at the beginning of the encounter.  Current Medications:  Current Outpatient Medications:    amoxicillin -clavulanate (AUGMENTIN ) 875-125 MG tablet, Take 1 tablet by mouth 2 (two) times daily. (Patient not taking: Reported on 09/29/2023), Disp: 20 tablet, Rfl: 0   busPIRone (BUSPAR) 7.5 MG tablet, Take 7.5 mg by mouth 2 (two) times daily., Disp: , Rfl:    cetirizine  (ZYRTEC ) 10 MG tablet, Take 1 tablet daily as needed for runny nose or itching, Disp: 30 tablet, Rfl: 5   CONCERTA 27 MG CR tablet, , Disp: , Rfl:    cromolyn  (OPTICROM ) 4 % ophthalmic solution, Place 1 drop into both eyes 4 (four) times daily as needed (watery itchy eyes)., Disp: 10 mL, Rfl: 5   dupilumab  (DUPIXENT ) 300 MG/2ML prefilled syringe, INJECT 1 SYRINGE SUBCUTANEOUSLY  EVERY OTHER WEEK, Disp: 4 mL, Rfl: 11   EPINEPHrine  0.3 mg/0.3 mL IJ SOAJ injection, Inject 0.3 mg into the muscle as needed for anaphylaxis., Disp: 2 each, Rfl: 1   fluconazole  (DIFLUCAN ) 150 MG tablet, Take 1 tablet (150 mg total) by mouth every three (3) days as needed. (Patient not taking: Reported on 09/29/2023), Disp: 2 tablet, Rfl: 0   fluticasone  (FLONASE ) 50 MCG/ACT nasal spray, Place 1 spray into both nostrils 2 (two) times daily., Disp: , Rfl:    methylPREDNISolone  (MEDROL  DOSEPAK) 4 MG TBPK tablet, Take as directed (Patient not taking: Reported on 09/29/2023),  Disp: 1 each, Rfl: 0   mometasone  (ELOCON ) 0.1 % ointment, For stubborn red itchy areas below your face, begin mometasome once a day as needed.  Do not use this medication for longer than 2 weeks in a row., Disp: 45 g, Rfl: 0   montelukast  (SINGULAIR ) 10 MG tablet, Take 1 tablet (10 mg total) by mouth at bedtime., Disp: 30 tablet, Rfl: 5   Olopatadine  HCl (PATADAY ) 0.2 % SOLN, Place 1 drop into both eyes daily. Use as needed for itchy water eyes, Disp: 2.5 mL, Rfl: 5   predniSONE  (STERAPRED UNI-PAK 21 TAB) 10 MG (21) TBPK tablet, Take as directed (Patient not taking: Reported on 03/03/2023), Disp: 21 tablet, Rfl: 0   tacrolimus  (PROTOPIC ) 0.1 % ointment, APPLY TOPICALLY TO RED AND ITCHY AREAS UP TO TWICE A DAY, Disp: 100 g, Rfl: 0   triamcinolone  (NASACORT ) 55 MCG/ACT AERO nasal inhaler, Place 2 sprays into the nose daily as needed (nasal congestion)., Disp: 1 each, Rfl: 5   Medications ordered in this encounter:  No orders of the defined types were placed in this encounter.    *If you need refills on other medications prior to your next appointment, please contact your pharmacy*  Follow-Up: Call back or seek an in-person evaluation if the symptoms worsen or if the condition fails to improve as anticipated.   Virtual Care [phone]  Other Instructions `Follow up with primary provider in 24-48 hours. Report to nearest ER with any worsening symptoms.    If you  have been instructed to have an in-person evaluation today at a local Urgent Care facility, please use the link below. It will take you to a list of all of our available [facility], including address, phone number and hours of operation. Please do not delay care.  [facility]  If you or a family member do not have a primary care provider, use the link below to schedule a visit and establish care. When you choose a [facility] primary care physician or advanced practice provider, you gain a  long-term partner in health. Find a Primary Care Provider  Learn more about [facility]'s in-office and virtual care options: [facility] - Get Care Now

## 2024-01-08 NOTE — Progress Notes (Signed)
 Virtual Visit Consent   Minie Roadcap, you are scheduled for a virtual visit with a [facility] provider today. Just as with appointments in the office, your consent must be obtained to participate. Your consent will be active for this visit and any virtual visit you may have with one of our providers in the next 365 days. If you have a MyChart account, a copy of this consent can be sent to you electronically.  As this is a virtual visit, video technology does not allow for your provider to perform a traditional examination. This may limit your provider's ability to fully assess your condition. If your provider identifies any concerns that need to be evaluated in person or the need to arrange testing (such as labs, EKG, etc.), we will make arrangements to do so. Although advances in technology are sophisticated, we cannot ensure that it will always work on either your end or our end. If the connection with a video visit is poor, the visit may have to be switched to a telephone visit. With either a video or telephone visit, we are not always able to ensure that we have a secure connection.  By engaging in this virtual visit, you consent to the provision of healthcare and authorize for your insurance to be billed (if applicable) for the services provided during this visit. Depending on your insurance coverage, you may receive a charge related to this service.  I need to obtain your verbal consent now. Are you willing to proceed with your visit today? Sheilia Reznick has provided verbal consent on 01/08/2024 for a virtual visit (video or telephone). Teena Shuck, [place]  Date: 01/08/2024 9:25 AM   Virtual Visit via Video Note   I, Teena Shuck, connected with  Julie Wood  ([id number], [birth year]) on 01/08/24 at  9:15 AM EST by a video-enabled telemedicine application and verified that I am speaking with the correct person using two identifiers.  Location: Patient: Virtual Visit Location  Patient: Home Provider: Virtual Visit Location Provider: Home Office   I discussed the limitations of evaluation and management by telemedicine and the availability of in person appointments. The patient expressed understanding and agreed to proceed.    History of Present Illness: Julie Wood is a 18 y.o. who identifies as a female who was assigned female at birth, and is being seen today for all.  HPI: Rash This is a recurrent problem. The current episode started in the past 7 days. The problem has been gradually worsening since onset. The affected locations include the face. The rash is characterized by dryness and redness. She was exposed to nothing. Pertinent negatives include no congestion, cough, diarrhea, eye pain, facial edema, fatigue, fever, joint pain, nail changes, rhinorrhea, shortness of breath, sore throat or vomiting. Past treatments include antihistamine. The treatment provided mild relief.    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 (Patient not taking: Reported on 09/29/2023), Disp: 20 tablet, Rfl: 0   busPIRone (BUSPAR) 7.5 MG tablet, Take 7.5 mg by mouth 2 (two) times daily., Disp: , Rfl:    cetirizine  (ZYRTEC ) 10 MG tablet, Take 1 tablet daily as needed for runny nose or itching, Disp: 30 tablet, Rfl: 5   CONCERTA 27 MG CR  tablet, , Disp: , Rfl:    cromolyn  (OPTICROM ) 4 % ophthalmic solution, Place 1 drop into both eyes 4 (four) times daily as needed (watery itchy eyes)., Disp: 10 mL, Rfl: 5   dupilumab  (DUPIXENT ) 300 MG/2ML prefilled syringe, INJECT 1 SYRINGE SUBCUTANEOUSLY  EVERY OTHER WEEK, Disp: 4 mL, Rfl: 11   EPINEPHrine  0.3 mg/0.3 mL IJ SOAJ injection, Inject 0.3 mg into the muscle as needed for anaphylaxis., Disp: 2 each, Rfl: 1   fluconazole  (DIFLUCAN ) 150 MG tablet, Take 1 tablet (150 mg total) by mouth every three (3) days as needed. (Patient not taking: Reported on 09/29/2023), Disp: 2 tablet, Rfl: 0   fluticasone  (FLONASE ) 50 MCG/ACT nasal spray, Place 1 spray into both nostrils 2 (two) times daily., Disp: , Rfl:    methylPREDNISolone  (MEDROL  DOSEPAK) 4 MG TBPK tablet, Take as directed (Patient not taking: Reported on 09/29/2023), Disp: 1 each, Rfl: 0   mometasone  (ELOCON ) 0.1 % ointment, For stubborn red itchy areas below your face, begin mometasome once a day as needed.  Do not use this medication for longer than 2 weeks in a row., Disp: 45 g, Rfl: 0   montelukast  (SINGULAIR ) 10 MG tablet, Take 1 tablet (10 mg total) by mouth at bedtime., Disp: 30 tablet, Rfl: 5   Olopatadine  HCl (PATADAY ) 0.2 % SOLN, Place 1 drop into both eyes daily. Use as needed for itchy water eyes, Disp: 2.5 mL, Rfl: 5   predniSONE  (STERAPRED UNI-PAK 21 TAB) 10 MG (21) TBPK tablet, Take as directed (Patient not taking: Reported on 03/03/2023), Disp: 21 tablet, Rfl: 0   tacrolimus  (PROTOPIC ) 0.1 % ointment, APPLY TOPICALLY TO RED AND ITCHY AREAS UP TO TWICE A DAY, Disp: 100 g, Rfl: 0   triamcinolone  (NASACORT ) 55 MCG/ACT AERO nasal inhaler, Place 2 sprays into the nose daily as needed (nasal congestion)., Disp: 1 each, Rfl: 5  Observations/Objective: Patient is well-developed, well-nourished in no acute distress.  Resting comfortably  at home.  Head is normocephalic, atraumatic.  No labored breathing.  Speech is clear and coherent  with logical content.  Patient is alert and oriented at baseline.  Small red papules with surrounding redness on face. No swelling or other lesions noted.   Assessment and Plan: 1. Superficial inflammatory acne vulgaris (Primary)  Patient presenting with what appears to be an acne flare. Considered atopic derm, SJS//TEN all seem unlikely. No shortness of breath or concern for emergent condition. Prescribed her topical triamcinolone  cream as this has worked in the past. Advised that if symptoms do not improve, she should use the topical erythromycin ointment as there could be a bacterial component at this time. Most importantly, to follow up with her dermatologist or return with worsening symptoms she verbalized understanding and agreement.   Follow Up Instructions: I discussed the assessment and treatment plan with the patient. The patient was provided an opportunity to ask questions and all were answered. The patient agreed with the plan and demonstrated an understanding of the instructions.  A copy of instructions were sent to the patient via MyChart unless otherwise noted below.     The patient was advised  to call back or seek an in-person evaluation if the symptoms worsen or if the condition fails to improve as anticipated.    Teena Shuck, PA-C

## 2024-01-20 ENCOUNTER — Ambulatory Visit: Admitting: Allergy

## 2024-02-10 ENCOUNTER — Ambulatory Visit: Admitting: Allergy

## 2024-02-25 ENCOUNTER — Emergency Department (HOSPITAL_COMMUNITY)
Admission: EM | Admit: 2024-02-25 | Discharge: 2024-02-25 | Discharge status: Against Medical Advice | Attending: Emergency Medicine | Admitting: Emergency Medicine

## 2024-02-25 ENCOUNTER — Encounter (HOSPITAL_COMMUNITY): Payer: Self-pay

## 2024-02-25 ENCOUNTER — Other Ambulatory Visit: Payer: Self-pay

## 2024-02-25 DIAGNOSIS — R1110 Vomiting, unspecified: Secondary | ICD-10-CM | POA: Insufficient documentation

## 2024-02-25 DIAGNOSIS — Z5321 Procedure and treatment not carried out due to patient leaving prior to being seen by health care provider: Secondary | ICD-10-CM | POA: Diagnosis not present

## 2024-02-25 LAB — URINALYSIS, ROUTINE W REFLEX MICROSCOPIC
Bacteria, UA: NONE SEEN
Bilirubin Urine: NEGATIVE
Glucose, UA: NEGATIVE mg/dL
Hgb urine dipstick: NEGATIVE
Ketones, ur: 20 mg/dL — AB
Nitrite: NEGATIVE
Protein, ur: NEGATIVE mg/dL
Specific Gravity, Urine: 1.03 (ref 1.005–1.030)
pH: 5 (ref 5.0–8.0)

## 2024-02-25 LAB — CBC WITH DIFFERENTIAL/PLATELET
Abs Immature Granulocytes: 0.02 K/uL (ref 0.00–0.07)
Basophils Absolute: 0 K/uL (ref 0.0–0.1)
Basophils Relative: 0 %
Eosinophils Absolute: 0 K/uL (ref 0.0–0.5)
Eosinophils Relative: 0 %
HCT: 41.2 % (ref 36.0–46.0)
Hemoglobin: 13.6 g/dL (ref 12.0–15.0)
Immature Granulocytes: 0 %
Lymphocytes Relative: 14 %
Lymphs Abs: 1 K/uL (ref 0.7–4.0)
MCH: 29.6 pg (ref 26.0–34.0)
MCHC: 33 g/dL (ref 30.0–36.0)
MCV: 89.6 fL (ref 80.0–100.0)
Monocytes Absolute: 0.7 K/uL (ref 0.1–1.0)
Monocytes Relative: 11 %
Neutro Abs: 5 K/uL (ref 1.7–7.7)
Neutrophils Relative %: 75 %
Platelets: 283 K/uL (ref 150–400)
RBC: 4.6 MIL/uL (ref 3.87–5.11)
RDW: 12 % (ref 11.5–15.5)
WBC: 6.7 K/uL (ref 4.0–10.5)
nRBC: 0 % (ref 0.0–0.2)

## 2024-02-25 LAB — COMPREHENSIVE METABOLIC PANEL WITH GFR
ALT: 13 U/L (ref 0–44)
AST: 17 U/L (ref 15–41)
Albumin: 4.4 g/dL (ref 3.5–5.0)
Alkaline Phosphatase: 108 U/L (ref 38–126)
Anion gap: 13 (ref 5–15)
BUN: 7 mg/dL (ref 6–20)
CO2: 23 mmol/L (ref 22–32)
Calcium: 9.7 mg/dL (ref 8.9–10.3)
Chloride: 101 mmol/L (ref 98–111)
Creatinine, Ser: 0.71 mg/dL (ref 0.44–1.00)
GFR, Estimated: >60 mL/min
Glucose, Bld: 116 mg/dL — ABNORMAL HIGH (ref 70–99)
Potassium: 3.7 mmol/L (ref 3.5–5.1)
Sodium: 136 mmol/L (ref 135–145)
Total Bilirubin: 1 mg/dL (ref 0.0–1.2)
Total Protein: 7.5 g/dL (ref 6.5–8.1)

## 2024-02-25 LAB — HCG, SERUM, QUALITATIVE: Preg, Serum: NEGATIVE

## 2024-02-25 LAB — LIPASE, BLOOD: Lipase: <10 U/L — ABNORMAL LOW (ref 11–51)

## 2024-02-25 NOTE — ED Triage Notes (Signed)
 Pt has been projectile vomiting since yesterday. Mom reports that pt has had fever and chills.

## 2024-02-25 NOTE — ED Notes (Signed)
 Called for pt to reassess vitals. No answer.

## 2024-02-25 NOTE — ED Notes (Signed)
 Pt called an additional 2 more times for vitals. No answer.

## 2024-06-16 ENCOUNTER — Ambulatory Visit: Admitting: Family Medicine
